# Patient Record
Sex: Female | Born: 1975 | Hispanic: Yes | Marital: Married | Smoking: Never smoker
Health system: Southern US, Community
[De-identification: ages and names within clinical notes are randomized; demographics above are authoritative.]

---

## 2015-10-19 ENCOUNTER — Ambulatory Visit
Admission: RE | Admit: 2015-10-19 | Discharge: 2015-10-19 | Disposition: A | Discharge status: Home / Self Care | Payer: BLUE CROSS/BLUE SHIELD | Source: Ambulatory Visit | Attending: Internal Medicine | Admitting: Internal Medicine

## 2015-10-19 ENCOUNTER — Other Ambulatory Visit: Payer: Self-pay | Admitting: Internal Medicine

## 2015-10-19 DIAGNOSIS — M545 Low back pain, unspecified: Secondary | ICD-10-CM

## 2016-09-04 ENCOUNTER — Emergency Department (HOSPITAL_COMMUNITY)
Admission: EM | Admit: 2016-09-04 | Discharge: 2016-09-04 | Disposition: A | Discharge status: Home / Self Care | Payer: BLUE CROSS/BLUE SHIELD | Attending: Emergency Medicine | Admitting: Emergency Medicine

## 2016-09-04 ENCOUNTER — Emergency Department (HOSPITAL_COMMUNITY): Payer: BLUE CROSS/BLUE SHIELD

## 2016-09-04 ENCOUNTER — Encounter (HOSPITAL_COMMUNITY): Payer: Self-pay | Admitting: Emergency Medicine

## 2016-09-04 DIAGNOSIS — T07XXXA Unspecified multiple injuries, initial encounter: Secondary | ICD-10-CM | POA: Diagnosis not present

## 2016-09-04 DIAGNOSIS — M79602 Pain in left arm: Secondary | ICD-10-CM | POA: Insufficient documentation

## 2016-09-04 DIAGNOSIS — Y999 Unspecified external cause status: Secondary | ICD-10-CM | POA: Insufficient documentation

## 2016-09-04 DIAGNOSIS — Y939 Activity, unspecified: Secondary | ICD-10-CM | POA: Insufficient documentation

## 2016-09-04 DIAGNOSIS — M79605 Pain in left leg: Secondary | ICD-10-CM | POA: Insufficient documentation

## 2016-09-04 DIAGNOSIS — S161XXA Strain of muscle, fascia and tendon at neck level, initial encounter: Secondary | ICD-10-CM

## 2016-09-04 DIAGNOSIS — M79604 Pain in right leg: Secondary | ICD-10-CM | POA: Insufficient documentation

## 2016-09-04 DIAGNOSIS — Y92410 Unspecified street and highway as the place of occurrence of the external cause: Secondary | ICD-10-CM | POA: Insufficient documentation

## 2016-09-04 DIAGNOSIS — S199XXA Unspecified injury of neck, initial encounter: Secondary | ICD-10-CM | POA: Diagnosis present

## 2016-09-04 MED ORDER — TRAMADOL HCL 50 MG PO TABS
50.0000 mg | ORAL_TABLET | Freq: Once | ORAL | Status: AC
  Administered 2016-09-04: 50 mg via ORAL
  Filled 2016-09-04: qty 1

## 2016-09-04 MED ORDER — IBUPROFEN 600 MG PO TABS: 600.0000 mg | ORAL_TABLET | Freq: Four times a day (QID) | ORAL | 0 refills | Status: AC | PRN

## 2016-09-04 MED ORDER — TRAMADOL HCL 50 MG PO TABS: 50.0000 mg | ORAL_TABLET | Freq: Four times a day (QID) | ORAL | 0 refills | Status: AC | PRN
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
## 2016-09-04 NOTE — ED Notes (Signed)
Patient transported to X-ray 
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
## 2022-06-03 ENCOUNTER — Emergency Department (HOSPITAL_COMMUNITY): Payer: BC Managed Care – PPO

## 2022-06-03 ENCOUNTER — Other Ambulatory Visit: Payer: Self-pay

## 2022-06-03 ENCOUNTER — Emergency Department (HOSPITAL_COMMUNITY)
Admission: EM | Admit: 2022-06-03 | Discharge: 2022-06-03 | Disposition: A | Discharge status: Home / Self Care | Payer: BC Managed Care – PPO | Attending: Emergency Medicine | Admitting: Emergency Medicine

## 2022-06-03 DIAGNOSIS — R072 Precordial pain: Secondary | ICD-10-CM | POA: Diagnosis not present

## 2022-06-03 DIAGNOSIS — R079 Chest pain, unspecified: Secondary | ICD-10-CM

## 2022-06-03 DIAGNOSIS — R0789 Other chest pain: Secondary | ICD-10-CM | POA: Diagnosis present

## 2022-06-03 LAB — CBC WITH DIFFERENTIAL/PLATELET
Abs Immature Granulocytes: 0.02 10*3/uL (ref 0.00–0.07)
Basophils Absolute: 0 10*3/uL (ref 0.0–0.1)
Basophils Relative: 0 %
Eosinophils Absolute: 0.1 10*3/uL (ref 0.0–0.5)
Eosinophils Relative: 2 %
HCT: 36.9 % (ref 36.0–46.0)
Hemoglobin: 12.2 g/dL (ref 12.0–15.0)
Immature Granulocytes: 0 %
Lymphocytes Relative: 34 %
Lymphs Abs: 2.5 10*3/uL (ref 0.7–4.0)
MCH: 27.2 pg (ref 26.0–34.0)
MCHC: 33.1 g/dL (ref 30.0–36.0)
MCV: 82.2 fL (ref 80.0–100.0)
Monocytes Absolute: 0.3 10*3/uL (ref 0.1–1.0)
Monocytes Relative: 5 %
Neutro Abs: 4.3 10*3/uL (ref 1.7–7.7)
Neutrophils Relative %: 59 %
Platelets: 259 10*3/uL (ref 150–400)
RBC: 4.49 MIL/uL (ref 3.87–5.11)
RDW: 16.6 % — ABNORMAL HIGH (ref 11.5–15.5)
WBC: 7.3 10*3/uL (ref 4.0–10.5)
nRBC: 0 % (ref 0.0–0.2)

## 2022-06-03 LAB — COMPREHENSIVE METABOLIC PANEL
ALT: 36 U/L (ref 0–44)
AST: 20 U/L (ref 15–41)
Albumin: 4.1 g/dL (ref 3.5–5.0)
Alkaline Phosphatase: 63 U/L (ref 38–126)
Anion gap: 13 (ref 5–15)
BUN: 10 mg/dL (ref 6–20)
CO2: 22 mmol/L (ref 22–32)
Calcium: 8.9 mg/dL (ref 8.9–10.3)
Chloride: 101 mmol/L (ref 98–111)
Creatinine, Ser: 0.77 mg/dL (ref 0.44–1.00)
GFR, Estimated: >60 mL/min (ref >60)
Glucose, Bld: 95 mg/dL (ref 70–99)
Potassium: 3.3 mmol/L — ABNORMAL LOW (ref 3.5–5.1)
Sodium: 136 mmol/L (ref 135–145)
Total Bilirubin: 0.2 mg/dL — ABNORMAL LOW (ref 0.3–1.2)
Total Protein: 7.3 g/dL (ref 6.5–8.1)

## 2022-06-03 LAB — T4, FREE: Free T4: 0.92 ng/dL (ref 0.61–1.12)

## 2022-06-03 LAB — TSH: TSH: 2.85 u[IU]/mL (ref 0.350–4.500)

## 2022-06-03 LAB — TROPONIN I (HIGH SENSITIVITY)
Troponin I (High Sensitivity): <2 ng/L (ref <18)
Troponin I (High Sensitivity): <2 ng/L (ref <18)

## 2022-06-03 LAB — LIPASE, BLOOD: Lipase: 31 U/L (ref 11–51)

## 2022-06-03 MED ORDER — ONDANSETRON HCL 4 MG PO TABS: 4.0000 mg | ORAL_TABLET | Freq: Four times a day (QID) | ORAL | 0 refills | Status: AC

## 2022-06-03 MED ORDER — ONDANSETRON HCL 4 MG/2ML IJ SOLN
4.0000 mg | Freq: Once | INTRAMUSCULAR | Status: AC
  Administered 2022-06-03: 4 mg via INTRAVENOUS
  Filled 2022-06-03: qty 2

## 2022-06-03 MED ORDER — KETOROLAC TROMETHAMINE 15 MG/ML IJ SOLN
15.0000 mg | Freq: Once | INTRAMUSCULAR | Status: AC
  Administered 2022-06-03: 15 mg via INTRAVENOUS
  Filled 2022-06-03: qty 1

## 2022-06-03 NOTE — ED Provider Notes (Signed)
[place] EMERGENCY DEPARTMENT AT [facility] Provider Note   CSN: [id number] Arrival date & time: 06/03/22  1735     History  Chief Complaint  Patient presents with   Chest Pain    Sharon Dawson is a 47 y.o. female significant past medical history presenting with 1 week of central, nonradiating chest pressure-like pain that she has seen her primary care for on Friday.  She was given an antacid that she takes twice daily, with no relief.  It is worse with deep breaths, stating that it feels like there is a "pulling" in her chest.    Chest Pain      Home Medications Prior to Admission medications   Medication Sig Start Date End Date Taking? Authorizing Provider  ondansetron (ZOFRAN) 4 MG tablet Take 1 tablet (4 mg total) by mouth every 6 (six) hours. 06/03/22  Yes Maryruth Eve, MD  ibuprofen (ADVIL,MOTRIN) 600 MG tablet Take 1 tablet (600 mg total) by mouth every 6 (six) hours as needed. 09/04/16   Burgess Amor, PA-C  traMADol (ULTRAM) 50 MG tablet Take 1 tablet (50 mg total) by mouth every 6 (six) hours as needed. 09/04/16   Burgess Amor, PA-C      Allergies    Patient has no known allergies.    Review of Systems   Review of Systems  Cardiovascular:  Positive for chest pain.    Physical Exam Updated Vital Signs BP 111/72   Pulse 64   Temp 98.1 F (36.7 C) (Oral)   Resp 16   LMP 05/20/2022 (Within Days)   SpO2 100%  Physical Exam Vitals and nursing note reviewed.  Constitutional:      General: She is not in acute distress.    Appearance: She is well-developed.  HENT:     Head: Normocephalic and atraumatic.  Eyes:     Extraocular Movements: Extraocular movements intact.     Conjunctiva/sclera: Conjunctivae normal.     Pupils: Pupils are equal, round, and reactive to light.  Cardiovascular:     Rate and Rhythm: Normal rate and regular rhythm.     Pulses:          Radial pulses are 2+ on the right side and 2+ on the left side.       Dorsalis pedis  pulses are 2+ on the right side and 2+ on the left side.     Heart sounds: No murmur heard. Pulmonary:     Effort: Pulmonary effort is normal. No respiratory distress.     Breath sounds: Normal breath sounds.  Chest:     Chest wall: Tenderness (sternal) present.  Abdominal:     Palpations: Abdomen is soft.     Tenderness: There is no abdominal tenderness.  Musculoskeletal:        General: No swelling.     Cervical back: Normal range of motion and neck supple.     Right lower leg: No tenderness. No edema.     Left lower leg: No tenderness. No edema.  Skin:    General: Skin is warm and dry.     Capillary Refill: Capillary refill takes less than 2 seconds.  Neurological:     Mental Status: She is alert.  Psychiatric:        Mood and Affect: Mood normal.     ED Results / Procedures / Treatments   Labs (all labs ordered are listed, but only abnormal results are displayed) Labs Reviewed  CBC WITH DIFFERENTIAL/PLATELET - Abnormal; Notable  for the following components:      Result Value   RDW 16.6 (*)    All other components within normal limits  COMPREHENSIVE METABOLIC PANEL - Abnormal; Notable for the following components:   Potassium 3.3 (*)    Total Bilirubin 0.2 (*)    All other components within normal limits  LIPASE, BLOOD  TSH  T4, FREE  TROPONIN I (HIGH SENSITIVITY)  TROPONIN I (HIGH SENSITIVITY)    EKG None  Radiology DG Chest Portable 1 View  Result Date: 06/03/2022 CLINICAL DATA:  chest pain EXAM: PORTABLE CHEST 1 VIEW COMPARISON:  None Available. FINDINGS: The cardiomediastinal silhouette is normal in contour. No pleural effusion. No pneumothorax. No acute pleuroparenchymal abnormality. IMPRESSION: No active disease. Electronically Signed   By: Meda Klinefelter M.D.   On: 06/03/2022 18:44    Medications Ordered in ED Medications  ondansetron (ZOFRAN) injection 4 mg (4 mg Intravenous Given 06/03/22 1816)  ketorolac (TORADOL) 15 MG/ML injection 15 mg (15 mg  Intravenous Given 06/03/22 2131)    ED Course/ Medical Decision Making/ A&P                             Medical Decision Making Amount and/or Complexity of Data Reviewed Labs: ordered. Radiology: ordered.  Risk Prescription drug management.   Patient presents hemodynamically stable.  On exam she has reproducible sternal chest pain.  No focal abnormal lung sounds.  Labs overall unremarkable including negative initial and delta troponins, TSH and lipase both WNL, no notable leukocytosis or electrolyte abnormalities.    Chest x-ray unremarkable for focal consolidation, pneumothorax, rib fractures  Patient was given dose of Toradol in the emergency department with some relief of her symptoms.  She was cleared for discharge and scheduled Motrin for the next few days to see if it improves her pain.  She already has follow-up for possible hiatal hernia with gastroenterology next week.  She was encouraged to keep this appointment could be contributing to her discomfort.        Final Clinical Impression(s) / ED Diagnoses Final diagnoses:  Chest pain, unspecified type    Rx / DC Orders ED Discharge Orders          Ordered    ondansetron (ZOFRAN) 4 MG tablet  Every 6 hours        06/03/22 2131              Maryruth Eve, MD 06/03/22 2352    Gerhard Munch, MD 06/06/22 [phone]

## 2022-06-03 NOTE — ED Notes (Signed)
Pt resting comfortably, call bell within reach.

## 2022-06-03 NOTE — Discharge Instructions (Addendum)
Your labs are all very reassuring, both of the labs that we would measure to make sure you are not having a heart attack or normal.  Your chest x-ray showed no signs of pneumonia or other injury.  Your other labs were all unremarkable.  Your EKG did not show any signs of heart injury.  You were given a shot of Toradol in the emergency department, this is a form of NSAIDs similar to Motrin.  If you go home you find that this alleviates your pain, you can take and milligrams of Motrin 3 times a day, please make sure to take this with food.  You can schedule it for the next 2 to 3 days to see if it keeps your pain away.  Please keep your appointment with the gastroenterologist that you already have scheduled.  However, should you experience severe abdominal pain, severe chest pain especially radiating down the left arm or up the left face, shortness of breath, sudden onset headache especially with vision changes, vomiting or inability to tolerate food or fluid please return to the emergency department.  You were given a prescription for Zofran, which is a medication for nausea that you can take as needed.  It dissolves under your tongue and ask in about 5 to 10 minutes.  You can take this prior to eating if you are feeling nauseous.

## 2022-06-03 NOTE — ED Triage Notes (Signed)
Pt presents with central chest pain x 1 week.  Seen for same and given antacid and scheduled endoscopy for next month. Pain has continued and pt also reports nausea began with it today.  Has had a sensation of "puling" in her chest.

## 2022-06-04 ENCOUNTER — Other Ambulatory Visit: Payer: Self-pay
